# Patient Record
Sex: Male | Born: 2000 | Race: White | Hispanic: No | Marital: Single | Smoking: Never smoker
Health system: Southern US, Community
[De-identification: ages and names within clinical notes are randomized; demographics above are authoritative.]

## PROBLEM LIST (undated history)

## (undated) DIAGNOSIS — R569 Unspecified convulsions: Secondary | ICD-10-CM

## (undated) HISTORY — DX: Unspecified convulsions: R56.9

## (undated) HISTORY — PX: WISDOM TOOTH EXTRACTION: SHX21

---
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
## 2019-01-09 DIAGNOSIS — R6889 Other general symptoms and signs: Secondary | ICD-10-CM | POA: Diagnosis not present

## 2019-01-09 DIAGNOSIS — B349 Viral infection, unspecified: Secondary | ICD-10-CM | POA: Diagnosis not present

## 2019-01-10 ENCOUNTER — Ambulatory Visit: Payer: Self-pay | Admitting: Diagnostic Neuroimaging

## 2019-01-30 ENCOUNTER — Ambulatory Visit: Payer: Self-pay | Admitting: Neurology

## 2019-06-13 DIAGNOSIS — R109 Unspecified abdominal pain: Secondary | ICD-10-CM | POA: Diagnosis not present

## 2019-06-13 DIAGNOSIS — R197 Diarrhea, unspecified: Secondary | ICD-10-CM | POA: Diagnosis not present

## 2019-07-17 DIAGNOSIS — R197 Diarrhea, unspecified: Secondary | ICD-10-CM | POA: Diagnosis not present

## 2019-07-17 DIAGNOSIS — R109 Unspecified abdominal pain: Secondary | ICD-10-CM | POA: Diagnosis not present

## 2019-07-23 DIAGNOSIS — R109 Unspecified abdominal pain: Secondary | ICD-10-CM | POA: Diagnosis not present

## 2019-07-23 DIAGNOSIS — R197 Diarrhea, unspecified: Secondary | ICD-10-CM | POA: Diagnosis not present

## 2019-08-20 DIAGNOSIS — R634 Abnormal weight loss: Secondary | ICD-10-CM | POA: Diagnosis not present

## 2019-08-20 DIAGNOSIS — R1084 Generalized abdominal pain: Secondary | ICD-10-CM | POA: Diagnosis not present

## 2019-08-20 DIAGNOSIS — R197 Diarrhea, unspecified: Secondary | ICD-10-CM | POA: Diagnosis not present

## 2019-08-20 DIAGNOSIS — R110 Nausea: Secondary | ICD-10-CM | POA: Diagnosis not present

## 2019-09-18 DIAGNOSIS — R634 Abnormal weight loss: Secondary | ICD-10-CM | POA: Diagnosis not present

## 2019-09-18 DIAGNOSIS — R197 Diarrhea, unspecified: Secondary | ICD-10-CM | POA: Diagnosis not present

## 2019-09-18 DIAGNOSIS — R1084 Generalized abdominal pain: Secondary | ICD-10-CM | POA: Diagnosis not present

## 2019-11-04 ENCOUNTER — Ambulatory Visit: Payer: Self-pay | Admitting: Neurology

## 2019-12-18 ENCOUNTER — Ambulatory Visit: Payer: 59 | Admitting: Neurology

## 2019-12-18 ENCOUNTER — Encounter: Payer: Self-pay | Admitting: Neurology

## 2019-12-18 ENCOUNTER — Telehealth: Payer: Self-pay | Admitting: Neurology

## 2019-12-18 ENCOUNTER — Other Ambulatory Visit: Payer: Self-pay

## 2019-12-18 VITALS — BP 122/81 | HR 62 | Temp 96.9°F | Ht 70.75 in | Wt 162.0 lb

## 2019-12-18 DIAGNOSIS — R404 Transient alteration of awareness: Secondary | ICD-10-CM | POA: Diagnosis not present

## 2019-12-18 NOTE — Progress Notes (Signed)
PATIENT: Marvin Hughes DOB: 04/21/2001  Chief Complaint  Patient presents with  . Seizures    He is here with his mother, Denny Peon. Reports having hx of vasovagal respone when giving blood. States while watching a Tik[employer] video that showed some bleeding from a finger, the patient felt hot and nauseous then had a loss of consciousness. His girlfriend, who witness the event, stated he was shaking all other for about 20 seconds. He has increased concern due to his father having a history of seizures. He has one similar event with body stiffness and rigidness.  Marland Kitchen PCP    Tally Joe, MD     HISTORICAL  Marvin Hughes is a 19 year old male, seen in request by his primary care physician Dr. Tally Joe, for evaluation of passing out spells, initial evaluation was on December 18, 2018.  I have reviewed and summarized the referring note from the referring physician.   He had 2 passing out episode, the most recent one was on November 21, 2018, he was sitting in the car watching the tip of video, it showed 1 drop of blood dripping along the fingers, he felt hot, nauseous, and loss of consciousness, shook all over according to his girlfriend, lasting for 20 seconds, followed by postevent confusion,  He also reported 1 episode of September 06, 2019, the day following colonoscopy preparation, he was preparing meal at home, suddenly felt lightheaded, fell to the ground, was witnessed by his girlfriend, eyes rolled back, body shaking, lasting for few minutes,  He had a history of vasovagal event in the past, it happened when he has blood draw  In addition, he complains of 70 pound weight loss over the past 1 year, is going through GI evaluation, he works as a Human resources officer for [employer].   His father had a history of seizure, but has not been evaluated,  Laboratory evaluations in July 17, 2019, normal CMP, creatinine of 1.06, CBC, hemoglobin of 14.9, negative endomysial antibody IgG,  transglutaminase IgA,   REVIEW OF SYSTEMS: Full 14 system review of systems performed and notable only for as above All other review of systems were negative.  ALLERGIES: Allergies  Allergen Reactions  . Augmentin [Amoxicillin-Pot Clavulanate]   . Penicillins     HOME MEDICATIONS: No current outpatient medications on file.   No current facility-administered medications for this visit.    PAST MEDICAL HISTORY: Past Medical History:  Diagnosis Date  . Seizure-like activity (HCC)     PAST SURGICAL HISTORY: Past Surgical History:  Procedure Laterality Date  . WISDOM TOOTH EXTRACTION      FAMILY HISTORY: Family History  Problem Relation Age of Onset  . Migraines Mother   . Ulcerative colitis Mother   . Fibromyalgia Mother   . Seizures Father   . Other Father        heat stroke  . Hypertension Father   . Breast cancer Maternal Grandmother   . Other Brother        GBS    SOCIAL HISTORY: Social History   Socioeconomic History  . Marital status: Single    Spouse name: Not on file  . Number of children: 0  . Years of education: some college  . Highest education level: High school graduate  Occupational History  . Occupation: IT sales professional  Tobacco Use  . Smoking status: Never Smoker  . Smokeless tobacco: Never Used  Substance and Sexual Activity  . Alcohol use: Not Currently  . Drug use: Never  .  Sexual activity: Not on file  Other Topics Concern  . Not on file  Social History Narrative   Lives with   Right-handed.   Caffeine use:   Social Determinants of Health   Financial Resource Strain:   . Difficulty of Paying Living Expenses: Not on file  Food Insecurity:   . Worried About Programme researcher, broadcasting/film/video in the Last Year: Not on file  . Ran Out of Food in the Last Year: Not on file  Transportation Needs:   . Lack of Transportation (Medical): Not on file  . Lack of Transportation (Non-Medical): Not on file  Physical Activity:   . Days of Exercise per  Week: Not on file  . Minutes of Exercise per Session: Not on file  Stress:   . Feeling of Stress : Not on file  Social Connections:   . Frequency of Communication with Friends and Family: Not on file  . Frequency of Social Gatherings with Friends and Family: Not on file  . Attends Religious Services: Not on file  . Active Member of Clubs or Organizations: Not on file  . Attends Banker Meetings: Not on file  . Marital Status: Not on file  Intimate Partner Violence:   . Fear of Current or Ex-Partner: Not on file  . Emotionally Abused: Not on file  . Physically Abused: Not on file  . Sexually Abused: Not on file     PHYSICAL EXAM   Vitals:   12/18/19 1408  Temp: (!) 96.9 F (36.1 C)  Weight: 162 lb (73.5 kg)  Height: 5' 10.75" (1.797 m)    Not recorded      Body mass index is 22.75 kg/m.  PHYSICAL EXAMNIATION:  Gen: NAD, conversant, well nourised, well groomed                     Cardiovascular: Regular rate rhythm, no peripheral edema, warm, nontender. Eyes: Conjunctivae clear without exudates or hemorrhage Neck: Supple, no carotid bruits. Pulmonary: Clear to auscultation bilaterally   NEUROLOGICAL EXAM:  MENTAL STATUS: Speech:    Speech is normal; fluent and spontaneous with normal comprehension.  Cognition:     Orientation to time, place and person     Normal recent and remote memory     Normal Attention span and concentration     Normal Language, naming, repeating,spontaneous speech     Fund of knowledge   CRANIAL NERVES: CN II: Visual fields are full to confrontation. Pupils are round equal and briskly reactive to light. CN III, IV, VI: extraocular movement are normal. No ptosis. CN V: Facial sensation is intact to light touch CN VII: Face is symmetric with normal eye closure  CN VIII: Hearing is normal to causal conversation. CN IX, X: Phonation is normal. CN XI: Head turning and shoulder shrug are intact  MOTOR: There is no pronator  drift of out-stretched arms. Muscle bulk and tone are normal. Muscle strength is normal.  REFLEXES: Reflexes are 2+ and symmetric at the biceps, triceps, knees, and ankles. Plantar responses are flexor.  SENSORY: Intact to light touch, pinprick and vibratory sensation are intact in fingers and toes.  COORDINATION: There is no trunk or limb dysmetria noted.  GAIT/STANCE: Posture is normal. Gait is steady with normal steps, base, arm swing, and turning. Heel and toe walking are normal. Tandem gait is normal.  Romberg is absent.   DIAGNOSTIC DATA (LABS, IMAGING, TESTING) - I reviewed patient records, labs, notes, testing and imaging myself  where available.   ASSESSMENT AND PLAN  Marvin Hughes is a 19 y.o. male   Passing out spells  Seizure-like activity following syncope versus seizure  Document all event  Complete evaluation with MRI of the brain with without contrast  EEG     Marcial Pacas, M.D. Ph.D.  [facility] [address], [place], [place] [address] Ph: [phone] Fax: [phone]  CC: Antony Contras, MD

## 2019-12-18 NOTE — Telephone Encounter (Signed)
[employer] order sent to GI. They will obtain the auth and reach out to the patient to schedule.  

## 2019-12-22 ENCOUNTER — Encounter: Payer: Self-pay | Admitting: Neurology

## 2019-12-31 ENCOUNTER — Other Ambulatory Visit: Payer: 59

## 2020-01-05 ENCOUNTER — Other Ambulatory Visit: Payer: Self-pay

## 2020-01-05 ENCOUNTER — Ambulatory Visit: Payer: 59 | Admitting: Neurology

## 2020-01-05 DIAGNOSIS — R55 Syncope and collapse: Secondary | ICD-10-CM | POA: Diagnosis not present

## 2020-01-05 DIAGNOSIS — R404 Transient alteration of awareness: Secondary | ICD-10-CM

## 2020-01-15 NOTE — Procedures (Addendum)
   HISTORY: 19 years old male presented with passing out episode  TECHNIQUE:  This is a routine 16 channel EEG recording with one channel devoted to a limited EKG recording.  It was performed during wakefulness, drowsiness and asleep.  Hyperventilation and photic stimulation were performed as activating procedures.  There are frequent electrodes artifact.  Upon maximum arousal, posterior dominant waking rhythm consistent of mildly dysrhythmic mixed alpha and theta range activity, with frequency of 8-10 hz. Activities are symmetric over the bilateral posterior derivations and attenuated with eye opening.  Hyperventilation produced mild/moderate buildup with higher amplitude and the slower activities noted.  Photic stimulation did not alter the tracing.  During EEG recording, patient developed drowsiness and no deeper stage of sleep was achieved During EEG recording, there was no epileptiform discharge noted.  EKG demonstrate sinus rhythm, with heart rate of 72 bpm  CONCLUSION: This is normal EEG. There is no evidence of epileptiform discharge.  Levert Feinstein, M.D. Ph.D.  [facility] [address] [place], [place] [address] Phone: [phone] Fax:      [phone]

## 2020-01-21 ENCOUNTER — Telehealth: Payer: Self-pay | Admitting: Neurology

## 2020-01-21 ENCOUNTER — Other Ambulatory Visit: Payer: Self-pay

## 2020-01-21 ENCOUNTER — Ambulatory Visit
Admission: RE | Admit: 2020-01-21 | Discharge: 2020-01-21 | Disposition: A | Discharge status: Home / Self Care | Payer: 59 | Source: Ambulatory Visit | Attending: Neurology | Admitting: Neurology

## 2020-01-21 DIAGNOSIS — R404 Transient alteration of awareness: Secondary | ICD-10-CM

## 2020-01-21 MED ORDER — GADOBENATE DIMEGLUMINE 529 MG/ML IV SOLN
15.0000 mL | Freq: Once | INTRAVENOUS | Status: AC | PRN
  Administered 2020-01-21: 15 mL via INTRAVENOUS

## 2020-01-21 NOTE — Telephone Encounter (Signed)
Please let patient know, there was no significant abnormalities on EEG

## 2020-01-21 NOTE — Telephone Encounter (Signed)
Left patient a detailed message, with results, on voicemail (ok per DPR).  Provided our number to call back with any questions.  

## 2020-01-23 ENCOUNTER — Telehealth: Payer: Self-pay | Admitting: Neurology

## 2020-01-23 DIAGNOSIS — R404 Transient alteration of awareness: Secondary | ICD-10-CM

## 2020-01-23 NOTE — Telephone Encounter (Addendum)
I have called his mother, eeg showed no significant abnormalities, but there was frequent mildly dysrhythmic activities, I will repeat EEG  MRI of the brain was normal  Please call him to have 2nd EEG study

## 2020-01-27 NOTE — Telephone Encounter (Signed)
I spoke to this mother and he has been scheduled for his repeat EEG on 02/16/20.

## 2020-01-27 NOTE — Addendum Note (Signed)
Addended by: Levert Feinstein on: 01/27/2020 02:12 PM   Modules accepted: Orders

## 2020-02-16 ENCOUNTER — Other Ambulatory Visit: Payer: Self-pay

## 2020-02-16 ENCOUNTER — Ambulatory Visit: Payer: 59

## 2020-02-16 DIAGNOSIS — R404 Transient alteration of awareness: Secondary | ICD-10-CM

## 2020-02-16 DIAGNOSIS — R55 Syncope and collapse: Secondary | ICD-10-CM | POA: Diagnosis not present

## 2020-02-24 ENCOUNTER — Telehealth: Payer: Self-pay | Admitting: Neurology

## 2020-02-24 NOTE — Procedures (Signed)
   HISTORY: 19 years old male, presented with passing out episode.  TECHNIQUE:  This is a routine 16 channel EEG recording with one channel devoted to a limited EKG recording.  It was performed during wakefulness, drowsiness and asleep.  Hyperventilation and photic stimulation were performed as activating procedures.  There are minimum muscle and movement artifact noted.  Upon maximum arousal, posterior dominant waking rhythm consistent of mildly dysrhythmic theta range activity. Activities are symmetric over the bilateral posterior derivations and attenuated with eye opening.  Hyperventilation produced mild/moderate buildup with higher amplitude and the slower activities noted.  Photic stimulation did not alter the tracing.  During EEG recording, patient developed drowsiness and no deeper stage of sleep was achieved During EEG recording, there was no epileptiform discharge noted.  EKG demonstrate sinus rhythm, with heart rate of 56 bpm  CONCLUSION: This is a mild abnormal EEG.  There is evidence of generalized background slowing, indicating mild bihemispheric malfunction, there was no evidence of epileptiform discharge.  Levert Feinstein, M.D. Ph.D.  [facility] [address] [place], [place] [address] Phone: [phone] Fax:      [phone]

## 2020-02-24 NOTE — Telephone Encounter (Signed)
I failed to reach patient and his mother, left message for them to call back.  EEG on February 16 2020 showed mild background slowing, there is no evidence of epileptiform discharge, if he has any recurrent spells of transient confusion, passing out, please let our office know.

## 2020-02-25 ENCOUNTER — Telehealth: Payer: Self-pay | Admitting: Neurology

## 2020-02-25 DIAGNOSIS — R404 Transient alteration of awareness: Secondary | ICD-10-CM

## 2020-02-25 NOTE — Telephone Encounter (Signed)
Pt's mother returned call. Please call back at [phone] when available.

## 2020-02-25 NOTE — Addendum Note (Signed)
Addended by: Levert Feinstein on: 02/25/2020 09:20 AM   Modules accepted: Orders

## 2020-02-25 NOTE — Telephone Encounter (Addendum)
I spoke to the patient's mother and provided her with the EEG results below. States that her son has not had any other episodes. Dr. Terrace Arabia is going to speak with her further today. See other note in Epic.

## 2020-02-25 NOTE — Telephone Encounter (Signed)
Left second message requesting a call back to schedule a specific date and time for lab collection.

## 2020-02-25 NOTE — Telephone Encounter (Addendum)
I was able to talk with his mother   eeg showed mildly slow and dysarythymic ( more than I expected from a healthy 19 years old)  He has no recurrent passing out spells, I would like him to come back to office for lab evaluations.   CONCLUSION: This is a mild abnormal EEG.  There is evidence of generalized background slowing, indicating mild bihemispheric malfunction, there was no evidence of epileptiform discharge.   I placed order for CMP, CBC, TSH, I would like to get drug screen, need approve from patient.

## 2020-02-25 NOTE — Telephone Encounter (Signed)
Left message for patient to return my call. We need to give him a specific appt time for his lab work. I will also need to speak with him when he arrives for the appt, once scheduled.

## 2020-02-26 NOTE — Telephone Encounter (Signed)
Left third message requesting a return call. 

## 2020-02-26 NOTE — Telephone Encounter (Signed)
Left fourth message for the patient to return my call.

## 2020-12-24 ENCOUNTER — Other Ambulatory Visit: Payer: Self-pay | Admitting: Physician Assistant

## 2020-12-24 DIAGNOSIS — N5089 Other specified disorders of the male genital organs: Secondary | ICD-10-CM

## 2021-01-04 ENCOUNTER — Ambulatory Visit
Admission: RE | Admit: 2021-01-04 | Discharge: 2021-01-04 | Disposition: A | Discharge status: Home / Self Care | Payer: No Typology Code available for payment source | Source: Ambulatory Visit | Attending: Physician Assistant | Admitting: Physician Assistant

## 2021-01-04 DIAGNOSIS — N5089 Other specified disorders of the male genital organs: Secondary | ICD-10-CM

## 2022-06-01 IMAGING — US US SCROTUM
1 series · 14 of 25 positions shown · non-contrast
Comparison: None.

CLINICAL DATA: Left testicular lump.

EXAM:
ULTRASOUND OF SCROTUM
TECHNIQUE: Complete ultrasound examination of the testicles, epididymis, and
other scrotal structures was performed.

[Series 1: us scrotum · 0.06mm/px · 14 of 66 slices shown]
[im 1/66]
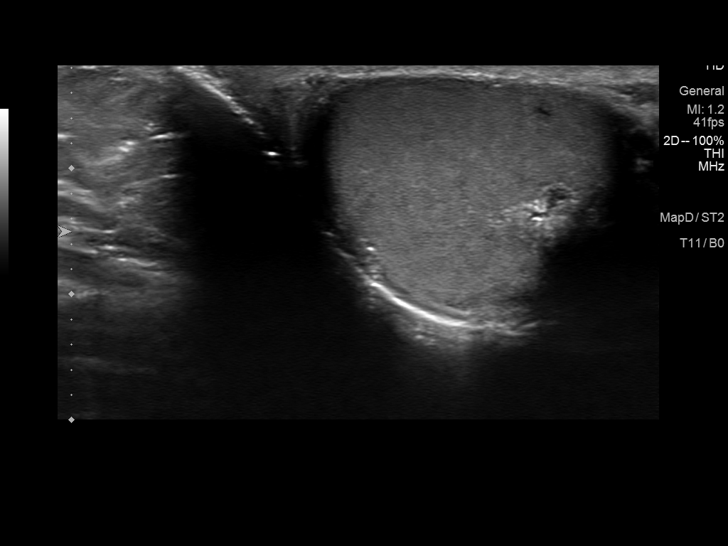
[im 6/66]
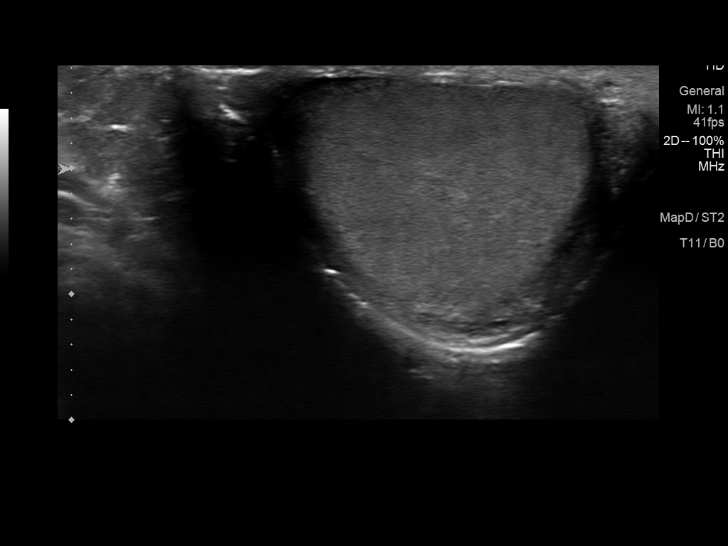
[im 11/66]
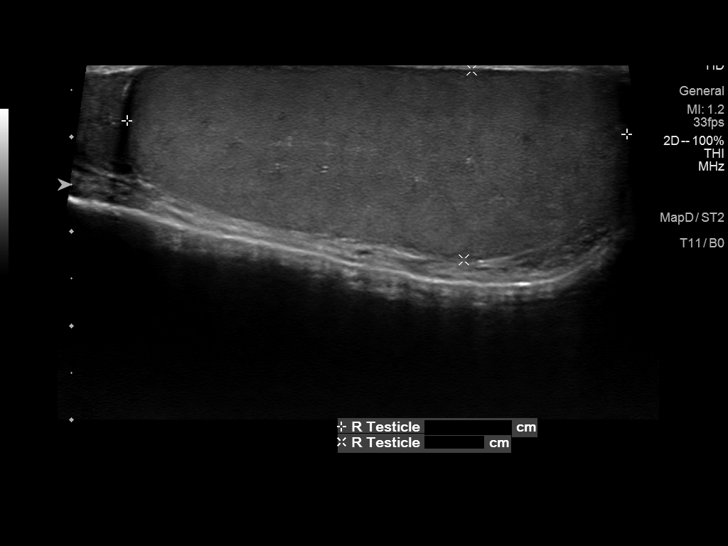
[im 17/66]
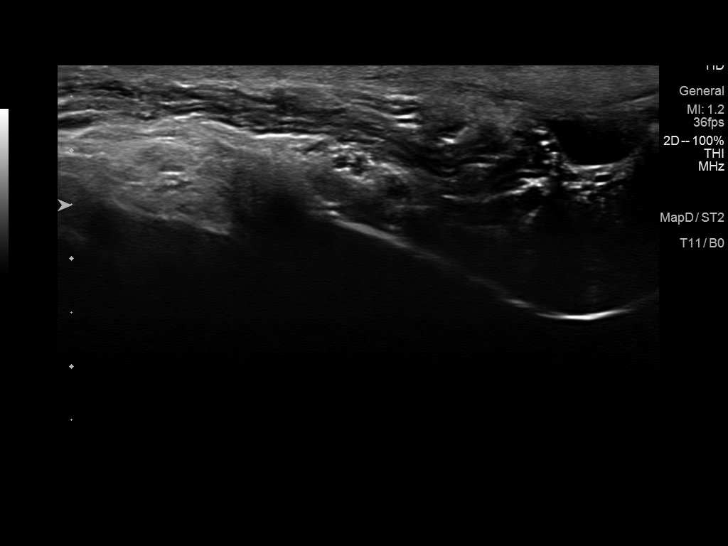
[im 22/66]
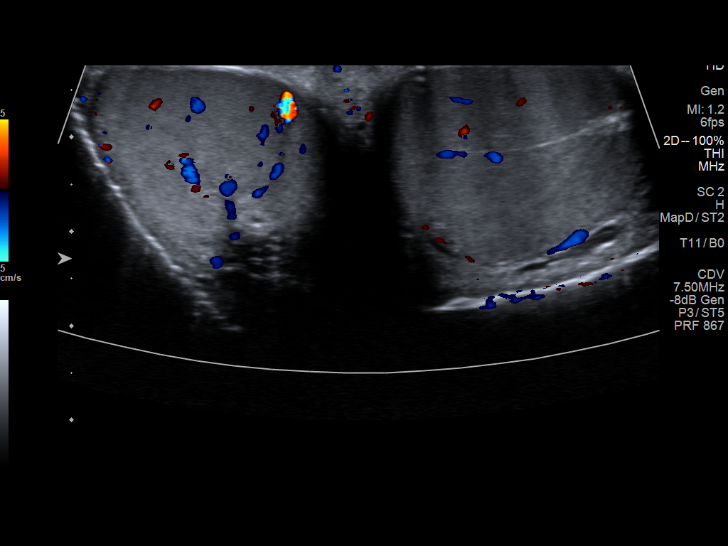
[im 25/66]
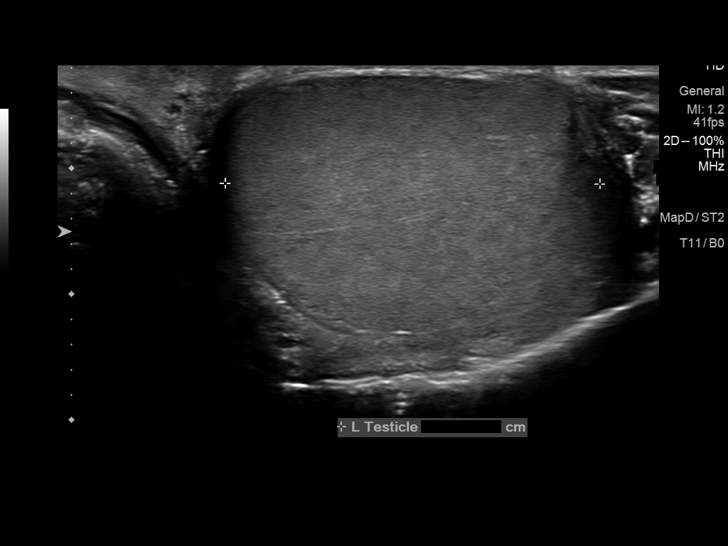
[im 30/66]
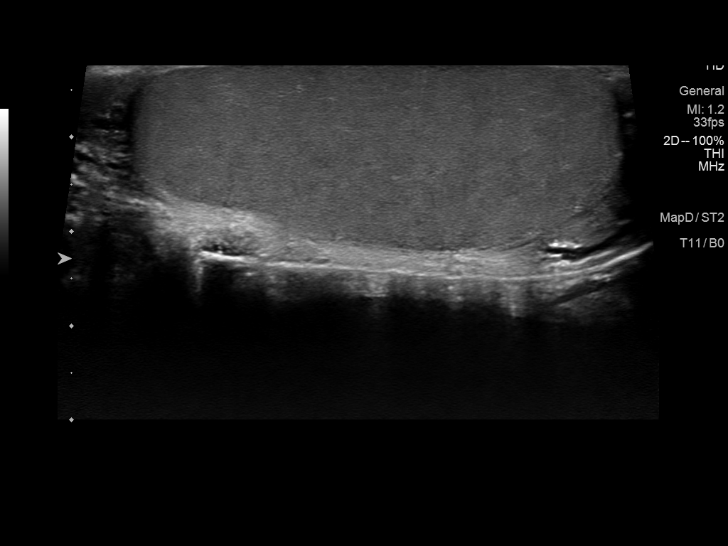
[im 36/66]
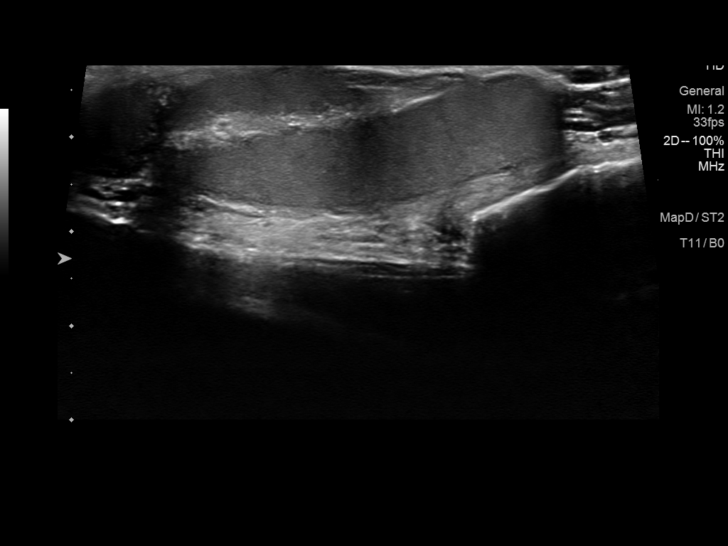
[im 41/66]
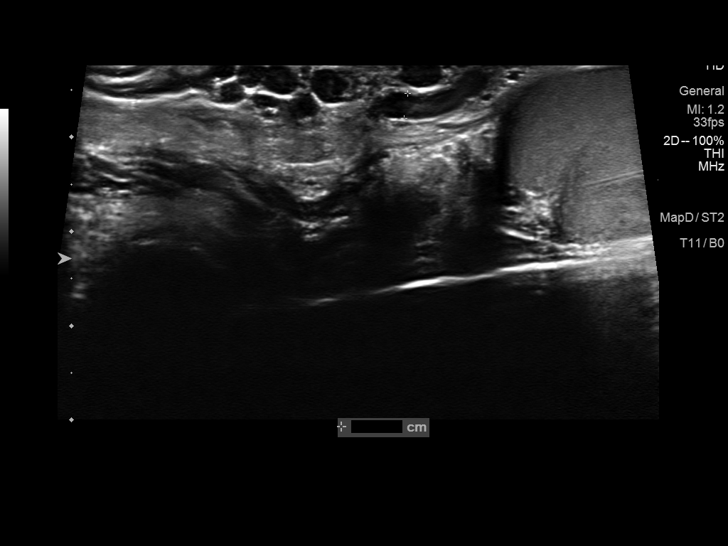
[im 44/66]
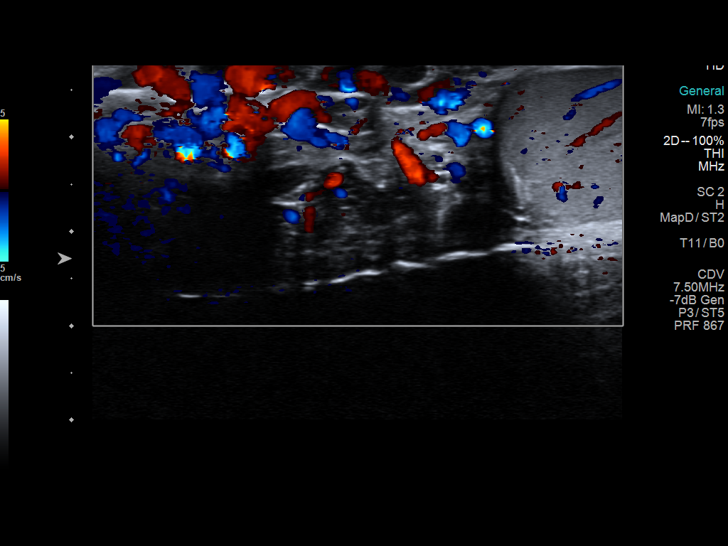
[im 49/66]
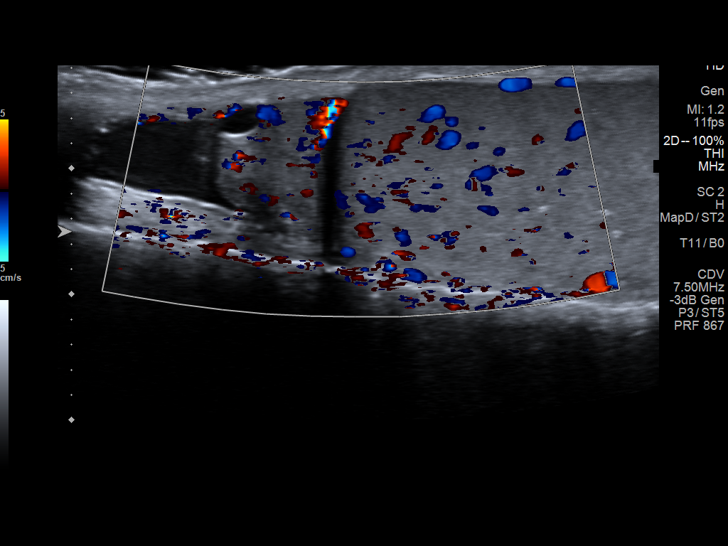
[im 55/66]
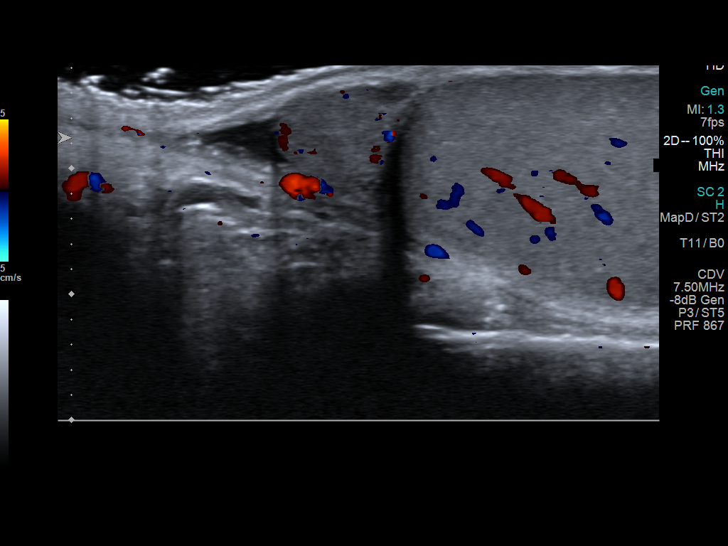
[im 60/66]
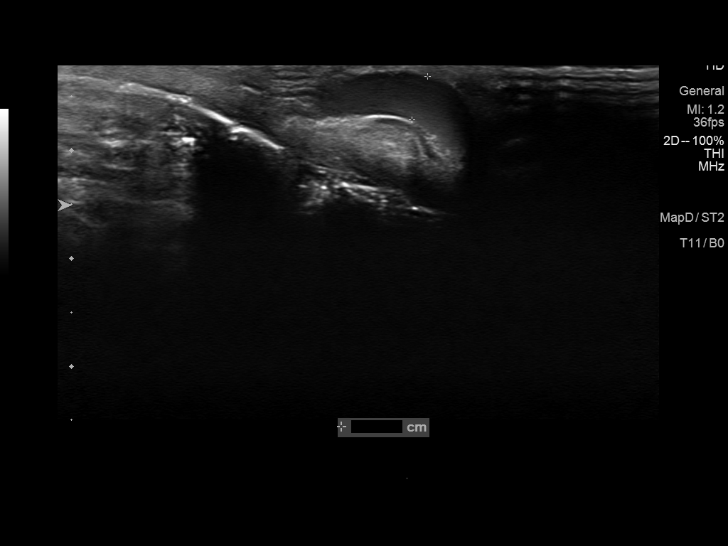
[im 66/66]
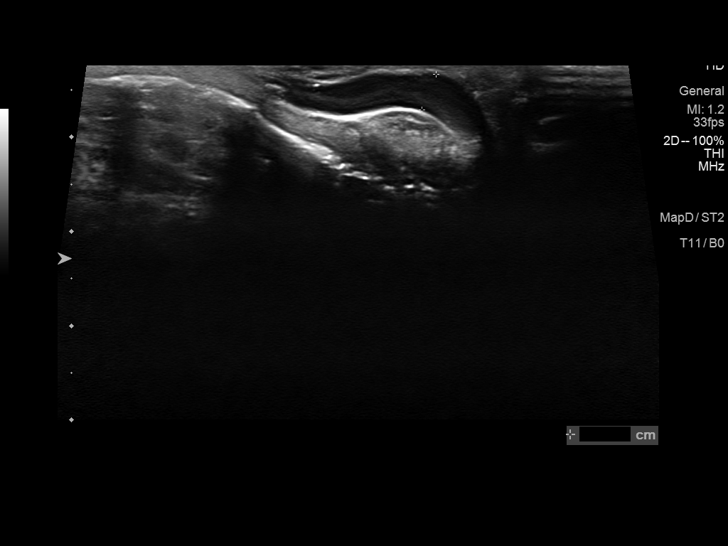

[14 of 25 positions shown; findings below may reference images not displayed]

FINDINGS: Right testicle

Measurements: 5.3 x 2 x 3.3 cm. No mass or microlithiasis
visualized.

Left testicle

Measurements: 5.2 x 2 x 3 cm. No mass or microlithiasis visualized.

Right epididymis:  There is a tiny 3 mm epididymal head cyst.

Left epididymis:  Normal in size and appearance.

Hydrocele:  None visualized.

Varicocele: There is a left-sided varicocele that appears to
correspond to the patient's area of concern.
IMPRESSION: 1. No testicular mass.
2. The patient's area of concern appears to correspond to the noted
left-sided varicocele.
